# Patient Record
Sex: Male | Born: 1975 | Race: Black or African American | Hispanic: No | Marital: Single | Smoking: Never smoker
Health system: Southern US, Community
[De-identification: ages and names within clinical notes are randomized; demographics above are authoritative.]

---

## 1998-09-29 ENCOUNTER — Emergency Department (HOSPITAL_COMMUNITY): Admission: EM | Admit: 1998-09-29 | Discharge: 1998-09-29 | Payer: Self-pay | Admitting: Emergency Medicine

## 2000-02-19 ENCOUNTER — Encounter: Payer: Self-pay | Admitting: Occupational Medicine

## 2000-02-19 ENCOUNTER — Encounter: Admission: RE | Admit: 2000-02-19 | Discharge: 2000-02-19 | Payer: Self-pay | Admitting: Occupational Medicine

## 2000-07-07 ENCOUNTER — Encounter: Payer: Self-pay | Admitting: Emergency Medicine

## 2000-07-07 ENCOUNTER — Emergency Department (HOSPITAL_COMMUNITY): Admission: EM | Admit: 2000-07-07 | Discharge: 2000-07-07 | Payer: Self-pay | Admitting: Emergency Medicine

## 2000-07-29 ENCOUNTER — Emergency Department (HOSPITAL_COMMUNITY): Admission: EM | Admit: 2000-07-29 | Discharge: 2000-07-29 | Payer: Self-pay | Admitting: Emergency Medicine

## 2000-09-08 ENCOUNTER — Emergency Department (HOSPITAL_COMMUNITY): Admission: EM | Admit: 2000-09-08 | Discharge: 2000-09-08 | Payer: Self-pay | Admitting: Emergency Medicine

## 2000-10-13 ENCOUNTER — Encounter: Admission: RE | Admit: 2000-10-13 | Discharge: 2000-12-02 | Payer: Self-pay | Admitting: Family Medicine

## 2001-01-14 ENCOUNTER — Encounter: Payer: Self-pay | Admitting: *Deleted

## 2001-01-14 ENCOUNTER — Emergency Department (HOSPITAL_COMMUNITY): Admission: EM | Admit: 2001-01-14 | Discharge: 2001-01-14 | Payer: Self-pay | Admitting: *Deleted

## 2004-01-08 ENCOUNTER — Emergency Department (HOSPITAL_COMMUNITY): Admission: EM | Admit: 2004-01-08 | Discharge: 2004-01-08 | Payer: Self-pay | Admitting: Family Medicine

## 2005-04-05 ENCOUNTER — Ambulatory Visit (HOSPITAL_COMMUNITY): Admission: RE | Admit: 2005-04-05 | Discharge: 2005-04-05 | Payer: Self-pay | Admitting: Family Medicine

## 2005-04-05 ENCOUNTER — Emergency Department (HOSPITAL_COMMUNITY): Admission: EM | Admit: 2005-04-05 | Discharge: 2005-04-05 | Payer: Self-pay | Admitting: Family Medicine

## 2006-01-06 ENCOUNTER — Emergency Department (HOSPITAL_COMMUNITY): Admission: EM | Admit: 2006-01-06 | Discharge: 2006-01-06 | Payer: Self-pay | Admitting: Family Medicine

## 2006-01-08 ENCOUNTER — Emergency Department (HOSPITAL_COMMUNITY): Admission: EM | Admit: 2006-01-08 | Discharge: 2006-01-08 | Payer: Self-pay | Admitting: Emergency Medicine

## 2007-12-01 IMAGING — CR DG CHEST 2V
2 series · 2 of 2 positions shown · non-contrast
Comparison: None.

CLINICAL DATA: Chest pain

[w chest pa]
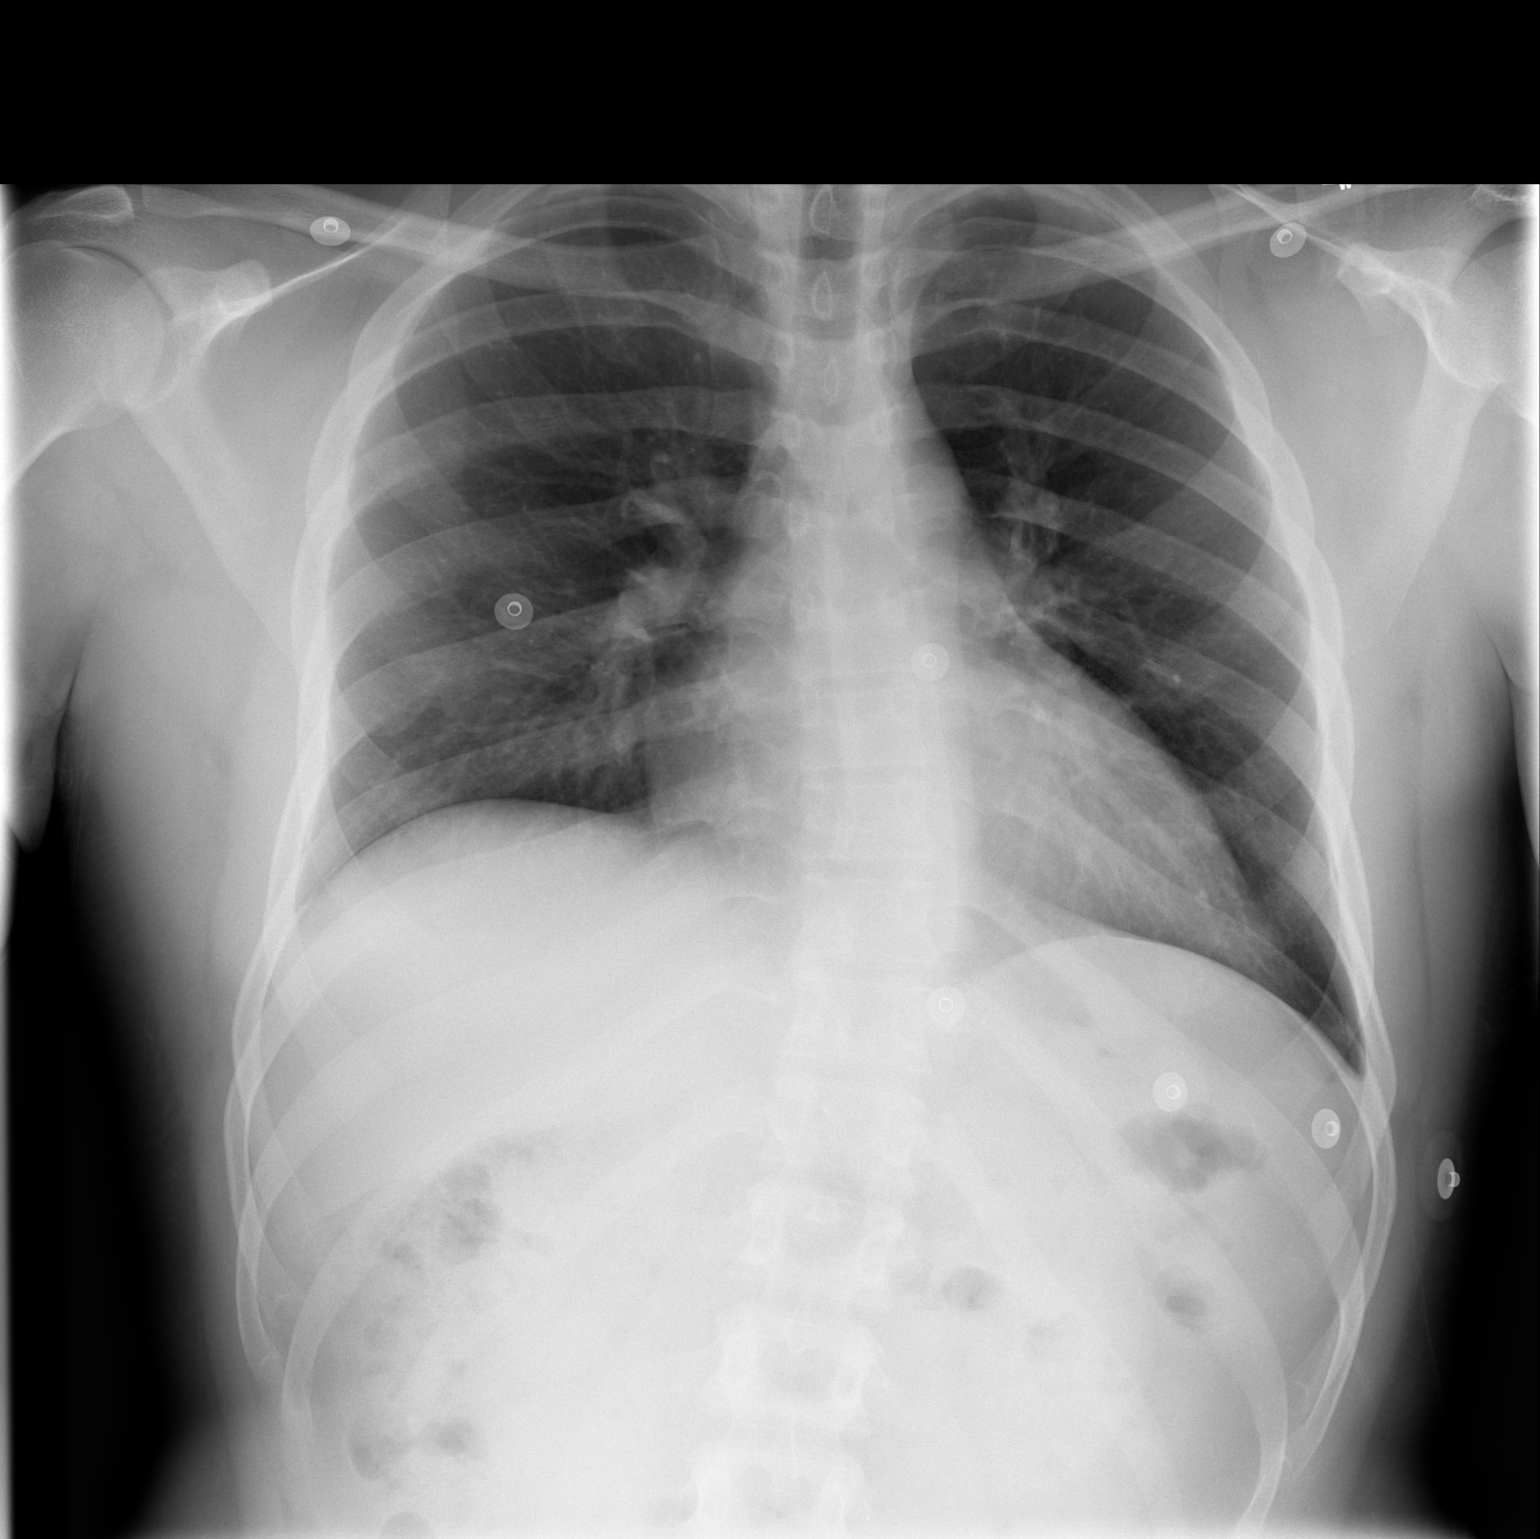

[w chest lat]
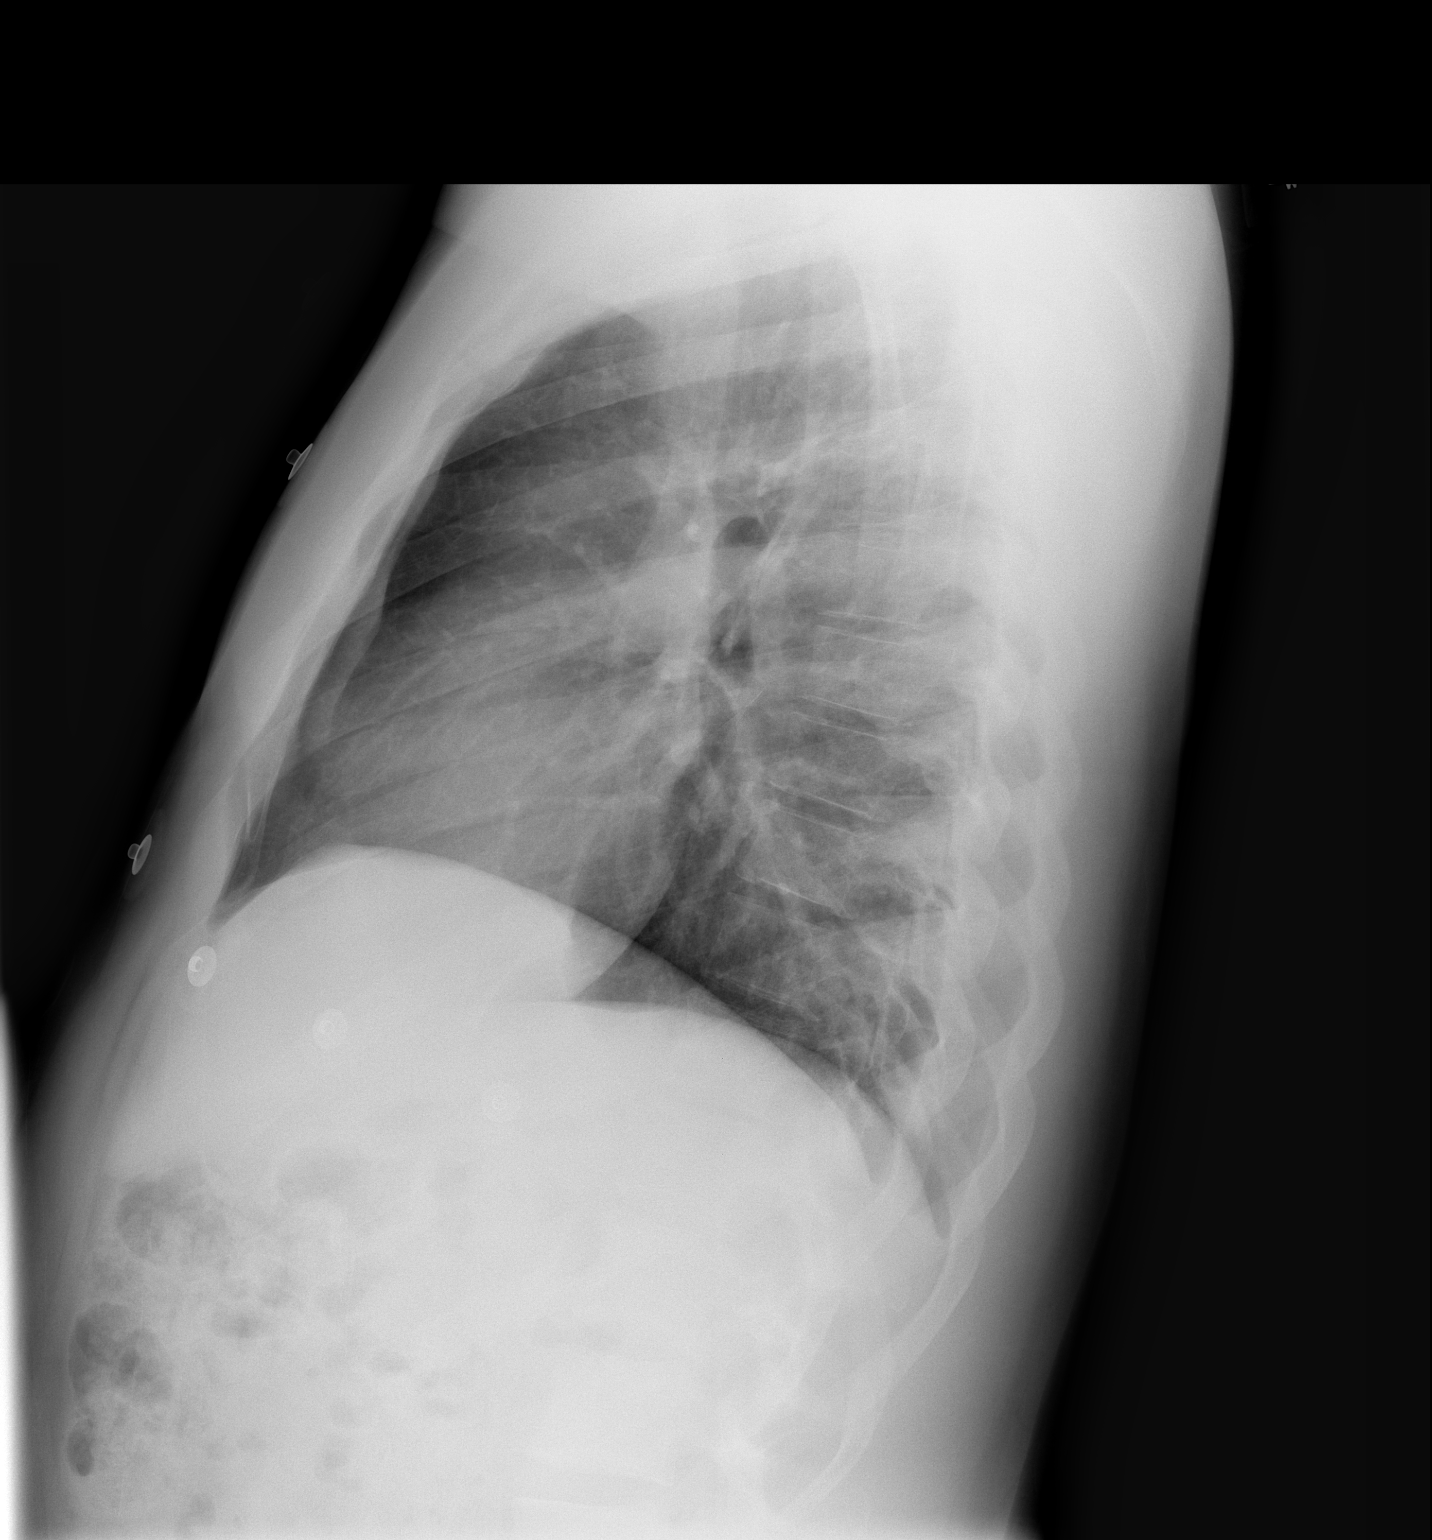

[2 of 2 positions shown; findings below may reference images not displayed]

CHEST - 2 VIEW: No acute cardiopulmonary process.

Low lung volumes. Low-volume film accentuates the cardiopericardial silhouette.
The lungs are clear without focal infiltrate, edema or pleural effusion. Convex
leftward lower thoracic scoliosis noted.
IMPRESSION: No acute cardiopulmonary process

## 2019-04-15 ENCOUNTER — Emergency Department (HOSPITAL_COMMUNITY)
Admission: EM | Admit: 2019-04-15 | Discharge: 2019-04-15 | Disposition: A | Discharge status: Home / Self Care | Payer: Self-pay | Attending: Emergency Medicine | Admitting: Emergency Medicine

## 2019-04-15 ENCOUNTER — Encounter (HOSPITAL_COMMUNITY): Payer: Self-pay | Admitting: Emergency Medicine

## 2019-04-15 ENCOUNTER — Other Ambulatory Visit: Payer: Self-pay

## 2019-04-15 DIAGNOSIS — T50901A Poisoning by unspecified drugs, medicaments and biological substances, accidental (unintentional), initial encounter: Secondary | ICD-10-CM | POA: Insufficient documentation

## 2019-04-15 DIAGNOSIS — Z5321 Procedure and treatment not carried out due to patient leaving prior to being seen by health care provider: Secondary | ICD-10-CM | POA: Insufficient documentation

## 2019-04-15 NOTE — ED Triage Notes (Addendum)
Patient is complaining of taking extra hydroxyzine on accident. Patient states he got a new prescription of the same medication and due to the pills having different colors he thought it was a new medication. Patient states he is scared he might have a seizure. Three times today he took 50 mg instead of 25mg .
